# Patient Record
Sex: Female | Born: 1990 | Race: Black or African American | Hispanic: No | Marital: Single
Health system: Southern US, Community
[De-identification: ages and names within clinical notes are randomized; demographics above are authoritative.]

---
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
## 2020-07-16 ENCOUNTER — Emergency Department (HOSPITAL_COMMUNITY)
Admission: EM | Admit: 2020-07-16 | Discharge: 2020-07-16 | Disposition: A | Discharge status: Home / Self Care | Payer: Self-pay | Attending: Emergency Medicine | Admitting: Emergency Medicine

## 2020-07-16 ENCOUNTER — Encounter (HOSPITAL_COMMUNITY): Payer: Self-pay | Admitting: Emergency Medicine

## 2020-07-16 ENCOUNTER — Other Ambulatory Visit: Payer: Self-pay

## 2020-07-16 DIAGNOSIS — R220 Localized swelling, mass and lump, head: Secondary | ICD-10-CM | POA: Insufficient documentation

## 2020-07-16 DIAGNOSIS — K047 Periapical abscess without sinus: Secondary | ICD-10-CM | POA: Insufficient documentation

## 2020-07-16 LAB — CBC WITH DIFFERENTIAL/PLATELET
Abs Immature Granulocytes: 0.02 10*3/uL (ref 0.00–0.07)
Basophils Absolute: 0 10*3/uL (ref 0.0–0.1)
Basophils Relative: 0 %
Eosinophils Absolute: 0.1 10*3/uL (ref 0.0–0.5)
Eosinophils Relative: 1 %
HCT: 38.4 % (ref 36.0–46.0)
Hemoglobin: 11.4 g/dL — ABNORMAL LOW (ref 12.0–15.0)
Immature Granulocytes: 0 %
Lymphocytes Relative: 18 %
Lymphs Abs: 1.6 10*3/uL (ref 0.7–4.0)
MCH: 24 pg — ABNORMAL LOW (ref 26.0–34.0)
MCHC: 29.7 g/dL — ABNORMAL LOW (ref 30.0–36.0)
MCV: 80.8 fL (ref 80.0–100.0)
Monocytes Absolute: 0.6 10*3/uL (ref 0.1–1.0)
Monocytes Relative: 6 %
Neutro Abs: 6.9 10*3/uL (ref 1.7–7.7)
Neutrophils Relative %: 75 %
Platelets: 272 10*3/uL (ref 150–400)
RBC: 4.75 MIL/uL (ref 3.87–5.11)
RDW: 15 % (ref 11.5–15.5)
WBC: 9.2 10*3/uL (ref 4.0–10.5)
nRBC: 0 % (ref 0.0–0.2)

## 2020-07-16 LAB — BASIC METABOLIC PANEL
Anion gap: 9 (ref 5–15)
BUN: 6 mg/dL (ref 6–20)
CO2: 24 mmol/L (ref 22–32)
Calcium: 9.2 mg/dL (ref 8.9–10.3)
Chloride: 105 mmol/L (ref 98–111)
Creatinine, Ser: 0.81 mg/dL (ref 0.44–1.00)
GFR, Estimated: >60 mL/min (ref >60)
Glucose, Bld: 144 mg/dL — ABNORMAL HIGH (ref 70–99)
Potassium: 4.1 mmol/L (ref 3.5–5.1)
Sodium: 138 mmol/L (ref 135–145)

## 2020-07-16 MED ORDER — PENICILLIN V POTASSIUM 250 MG PO TABS
500.0000 mg | ORAL_TABLET | Freq: Once | ORAL | Status: AC
  Administered 2020-07-16: 500 mg via ORAL
  Filled 2020-07-16: qty 2

## 2020-07-16 MED ORDER — PENICILLIN V POTASSIUM 500 MG PO TABS: 500.0000 mg | ORAL_TABLET | Freq: Four times a day (QID) | ORAL | 0 refills | Status: AC

## 2020-07-16 NOTE — ED Provider Notes (Addendum)
[facility] EMERGENCY DEPARTMENT Provider Note   CSN: [id number] Arrival date & time: 07/16/20  [id number]     History Chief Complaint  Patient presents with  . Facial Swelling    Maureen Richards is a 29 y.o. female otherwise healthy no daily medication use.  Patient presents for left facial pain and swelling that began Sunday after eating lunch.  She has no known food allergies and did not eat or try any new foods.  She noticed a mild pain and aching sensation to her left cheek Sunday afternoon.  She then went to work on night shift noticed swelling had extended up her cheek somewhat she took 1 Benadryl without improvement of her symptoms yesterday.  When she woke up this morning symptoms continued so she comes into the ER for evaluation.  She describes a mild constant aching sensation to her left cheek nonradiating worsened with palpation improved with rest.  Denies fever/chills, headache, vision changes, pain with eye movement, trismus, difficulty swallowing, neck pain, neck swelling, nausea/vomiting, diarrhea, abdominal pain, difficulty breathing, cough or any additional concerns.  Of note patient denies chance of pregnancy today.  Patient states understanding that medications given or prescribed today may result in harm to of a pregnancy and she accepts these risks and still chooses not to be pregnancy tested and proceed with medications and testing as needed.  HPI     History reviewed. No pertinent past medical history.  There are no problems to display for this patient.   History reviewed. No pertinent surgical history.   OB History   No obstetric history on file.     No family history on file.  Social History   Tobacco Use  . Smoking status: Not on file  Substance Use Topics  . Alcohol use: No  . Drug use: Not on file    Home Medications Prior to Admission medications   Medication Sig Start Date End Date Taking? Authorizing Provider  penicillin  v potassium (VEETID) 500 MG tablet Take 1 tablet (500 mg total) by mouth 4 (four) times daily for 7 days. 07/16/20 07/23/20  Harlene Salts A, PA-C    Allergies    Patient has no known allergies.  Review of Systems   Review of Systems Ten systems are reviewed and are negative for acute change except as noted in the HPI  Physical Exam Updated Vital Signs BP 120/68   Pulse 84   Temp 98.7 F (37.1 C) (Oral)   Resp (!) 26   Ht 5\' 8"  (1.727 m)   Wt (!) 141.1 kg   SpO2 100%   BMI 47.29 kg/m   Physical Exam Constitutional:      General: She is not in acute distress.    Appearance: Normal appearance. She is well-developed. She is obese. She is not ill-appearing or diaphoretic.  HENT:     Head: Normocephalic and atraumatic.     Jaw: There is normal jaw occlusion. No trismus.      Comments: Swelling of the left cheek.    Mouth/Throat:      Comments: Poor dentition overall multiple dental caries present.  Tenderness first left upper molar.  The patient has normal phonation and is in control of secretions. No stridor.  Midline uvula without edema. Soft palate rises symmetrically. No tonsillar erythema, swelling or exudates. Tongue protrusion is normal, floor of mouth is soft. No trismus. No creptius on neck palpation. No gingival erythema or fluctuance noted. Mucus membranes moist. No pallor noted.  Eyes:     General: Vision grossly intact. Gaze aligned appropriately.     Pupils: Pupils are equal, round, and reactive to light.  Neck:     Trachea: Trachea and phonation normal. No tracheal tenderness or tracheal deviation.  Pulmonary:     Effort: Pulmonary effort is normal. No respiratory distress.  Abdominal:     General: There is no distension.     Palpations: Abdomen is soft.     Tenderness: There is no abdominal tenderness. There is no guarding or rebound.  Musculoskeletal:        General: Normal range of motion.     Cervical back: Normal range of motion and neck supple. No  edema or rigidity.  Skin:    General: Skin is warm and dry.  Neurological:     Mental Status: She is alert.     GCS: GCS eye subscore is 4. GCS verbal subscore is 5. GCS motor subscore is 6.     Comments: Speech is clear and goal oriented, follows commands Major Cranial nerves without deficit, no facial droop Moves extremities without ataxia, coordination intact  Psychiatric:        Behavior: Behavior normal.     ED Results / Procedures / Treatments   Labs (all labs ordered are listed, but only abnormal results are displayed) Labs Reviewed  CBC WITH DIFFERENTIAL/PLATELET - Abnormal; Notable for the following components:      Result Value   Hemoglobin 11.4 (*)    MCH 24.0 (*)    MCHC 29.7 (*)    All other components within normal limits  BASIC METABOLIC PANEL - Abnormal; Notable for the following components:   Glucose, Bld 144 (*)    All other components within normal limits    EKG None  Radiology No results found.  Procedures Procedures (including critical care time)  Medications Ordered in ED Medications  penicillin v potassium (VEETID) tablet 500 mg (500 mg Oral Given 07/16/20 1029)    ED Course  I have reviewed the triage vital signs and the nursing notes.  Pertinent labs & imaging results that were available during my care of the patient were reviewed by me and considered in my medical decision making (see chart for details).    MDM Rules/Calculators/A&P                         Additional history obtained from: 1. Nursing notes from this visit. 2. Review of electronic medical records ----------- 29 year old female presents today for pain and swelling of the left cheek that began 2 days ago after eating.  She has no known allergies and was not exposed to any new foods.  No symptoms to suggest anaphylaxis. She attempted Benadryl x1 yesterday without improvement of her symptoms.  On examination she has poor dentition and some tenderness to the left cheek near  maxilla, no evidence of sinusitis.  Suspect patient is experiencing dental pain and swelling associated to dental infection, no evidence of drainable dental abscess today.  Additionally no evidence of PTA, RPA, Ludwig's or other deep space infections of the head or neck.  Plan of care start patient on penicillin VK 500 mg 4 times daily x7 days and referred to dentist for definitive management.  No indication for imaging or admission at this time.  Additionally low suspicion for allergic reaction given history and examination.  At this time there does not appear to be any evidence of an acute emergency medical condition  and the patient appears stable for discharge with appropriate outpatient follow up. Diagnosis was discussed with patient who verbalizes understanding of care plan and is agreeable to discharge. I have discussed return precautions with patient who verbalizes understanding. Patient encouraged to follow-up with their PCP and dentist. All questions answered.  Patient seen and evaluated by Dr. Particia Nearing during this visit who agrees with discharge with antibiotics and dental follow-up. ---------------------------------------- Addendum: Patient had basic blood work obtained in triage, I reviewed those labs.  BMP showed glucose 144 otherwise normal limits, no emergent electrolyte derangement AKI or gap.  CBC shows no leukocytosis, mild anemia of 11.4 no prior to compare and no symptoms to suggest symptomatic anemia.  Patient informed and is to follow-up with her PCP.  Note: Portions of this report may have been transcribed using voice recognition software. Every effort was made to ensure accuracy; however, inadvertent computerized transcription errors may still be present. Final Clinical Impression(s) / ED Diagnoses Final diagnoses:  Dental infection  Facial swelling    Rx / DC Orders ED Discharge Orders         Ordered    penicillin v potassium (VEETID) 500 MG tablet  4 times daily         07/16/20 1031           Bill Salinas, PA-C 07/16/20 1032    Elizabeth Palau 07/16/20 1034    Jacalyn Lefevre, MD 07/16/20 1506

## 2020-07-16 NOTE — ED Triage Notes (Signed)
Pt coming from home. Complaint of facial swelling that began after lunch on Sunday. Pt states it is getting progressively worse. Took benadryl yesterday.

## 2020-07-16 NOTE — Discharge Instructions (Addendum)
At this time there does not appear to be the presence of an emergent medical condition, however there is always the potential for conditions to change. Please read and follow the below instructions.  Please return to the Emergency Department immediately for any new or worsening symptoms or if your symptoms do not improve within 3 days. Please be sure to follow up with your Primary Care Provider within one week regarding your visit today; please call their office to schedule an appointment even if you are feeling better for a follow-up visit. Please take your antibiotic Penicillin as prescribed until complete to help with your symptoms.  Please drink enough water to avoid dehydration and get plenty of rest. Please take Ibuprofen (Advil, motrin) and Tylenol (acetaminophen) to relieve your pain.  You may take up to 400 MG (2 pills) of normal strength ibuprofen every 8 hours as needed.  In between doses of ibuprofen you make take tylenol, up to 500 mg (one extra strength pill).  Do not take more than 3,000 mg tylenol in a 24 hour period.  Please check all medication labels as many medications such as pain and cold medications may contain tylenol.  Do not drink alcohol while taking these medications.  Do not take other NSAID'S while taking ibuprofen (such as aleve or naproxen).  Please take ibuprofen with food to decrease stomach upset. Please call the dentist Dr. Lucky Cowboy under discharge paperwork today to schedule a follow-up appointment for definitive dental care. Additionally your blood work today showed a mild anemia with hemoglobin level 11.4, and elevated blood sugar level; please discuss this with your primary care provider at your follow-up visit.  Go to the nearest Emergency Department immediately if: You have fever or chills You cannot open your mouth. You are having trouble breathing or swallowing. Your face, neck, or jaw is swollen. You have belly pain or vomit You have any new/concerning or  worsening of symptoms  Please read the additional information packets attached to your discharge summary.  Do not take your medicine if  develop an itchy rash, swelling in your mouth or lips, or difficulty breathing; call 911 and seek immediate emergency medical attention if this occurs.  You may review your lab tests and imaging results in their entirety on your MyChart account.  Please discuss all results of fully with your primary care provider and other specialist at your follow-up visit.  Note: Portions of this text may have been transcribed using voice recognition software. Every effort was made to ensure accuracy; however, inadvertent computerized transcription errors may still be present.
